# Patient Record
Sex: Male | Born: 1935 | Race: White | Hispanic: No | Marital: Married
Health system: Southern US, Community
[De-identification: ages and names within clinical notes are randomized; demographics above are authoritative.]

---

## 2012-03-03 ENCOUNTER — Emergency Department: Payer: Self-pay

## 2012-03-03 LAB — COMPREHENSIVE METABOLIC PANEL
Albumin: 3.4 g/dL (ref 3.4–5.0)
Alkaline Phosphatase: 89 U/L (ref 50–136)
BUN: 16 mg/dL (ref 7–18)
Bilirubin,Total: 0.8 mg/dL (ref 0.2–1.0)
Chloride: 105 mmol/L (ref 98–107)
Co2: 27 mmol/L (ref 21–32)
Creatinine: 0.75 mg/dL (ref 0.60–1.30)
EGFR (African American): >60
EGFR (Non-African Amer.): >60
Glucose: 145 mg/dL — ABNORMAL HIGH (ref 65–99)
Osmolality: 283 (ref 275–301)
Potassium: 4 mmol/L (ref 3.5–5.1)
SGOT(AST): 28 U/L (ref 15–37)
SGPT (ALT): 20 U/L (ref 12–78)
Total Protein: 7 g/dL (ref 6.4–8.2)

## 2012-03-03 LAB — URINALYSIS, COMPLETE
Blood: NEGATIVE
Ketone: NEGATIVE
Nitrite: NEGATIVE
Ph: 7 (ref 4.5–8.0)
Protein: NEGATIVE
Specific Gravity: 1.012 (ref 1.003–1.030)
WBC UR: <1 /HPF (ref 0–5)

## 2012-03-03 LAB — CBC
HGB: 14.8 g/dL (ref 13.0–18.0)
MCHC: 33.4 g/dL (ref 32.0–36.0)
RDW: 12.9 % (ref 11.5–14.5)

## 2012-04-25 ENCOUNTER — Ambulatory Visit: Payer: Self-pay | Admitting: Internal Medicine

## 2012-04-29 ENCOUNTER — Inpatient Hospital Stay: Payer: Self-pay | Admitting: Internal Medicine

## 2012-04-29 LAB — CBC
HCT: 42.5 % (ref 40.0–52.0)
MCHC: 33.1 g/dL (ref 32.0–36.0)
MCV: 99 fL (ref 80–100)
Platelet: 234 10*3/uL (ref 150–440)
RBC: 4.28 10*6/uL — ABNORMAL LOW (ref 4.40–5.90)
RDW: 14.5 % (ref 11.5–14.5)
WBC: 15.3 10*3/uL — ABNORMAL HIGH (ref 3.8–10.6)

## 2012-04-29 LAB — COMPREHENSIVE METABOLIC PANEL
Anion Gap: 4 — ABNORMAL LOW (ref 7–16)
BUN: 17 mg/dL (ref 7–18)
Bilirubin,Total: 0.8 mg/dL (ref 0.2–1.0)
Chloride: 100 mmol/L (ref 98–107)
Creatinine: 0.69 mg/dL (ref 0.60–1.30)
EGFR (African American): >60
Osmolality: 272 (ref 275–301)
Potassium: 4.6 mmol/L (ref 3.5–5.1)
SGOT(AST): 42 U/L — ABNORMAL HIGH (ref 15–37)
Sodium: 134 mmol/L — ABNORMAL LOW (ref 136–145)
Total Protein: 6 g/dL — ABNORMAL LOW (ref 6.4–8.2)

## 2012-04-29 LAB — PROTIME-INR: INR: 1

## 2012-04-29 LAB — URINALYSIS, COMPLETE
Leukocyte Esterase: NEGATIVE
Nitrite: NEGATIVE
Protein: NEGATIVE
RBC,UR: <1 /HPF (ref 0–5)
WBC UR: 1 /HPF (ref 0–5)

## 2012-04-29 LAB — TROPONIN I: Troponin-I: 0.08 ng/mL — ABNORMAL HIGH

## 2012-05-26 ENCOUNTER — Ambulatory Visit: Payer: Self-pay | Admitting: Internal Medicine

## 2012-07-24 DEATH — deceased

## 2013-09-12 IMAGING — CT CT HEAD WITHOUT CONTRAST
2 series · 15 of 30 positions shown, 19 images · non-contrast
Comparison: none

REASON FOR EXAM: change inms
COMMENTS:

[Series 2: soft tissue · axial · 0.43mm/px · z∈[-128,-3]mm · 13 of 31 slices shown, 17 images]
[im 3/31  brain]
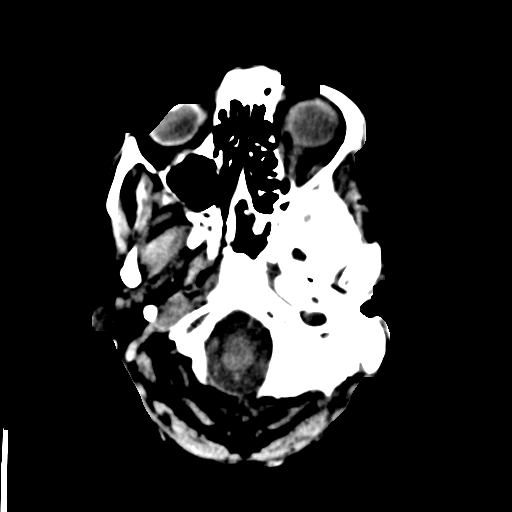
[im 3/31  bone]
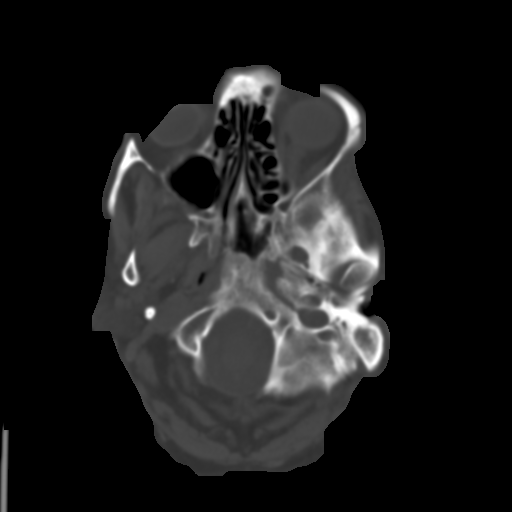
[im 5/31  brain]
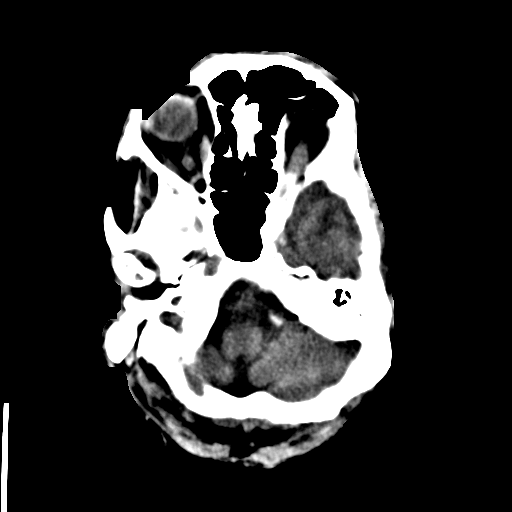
[im 7/31  brain]
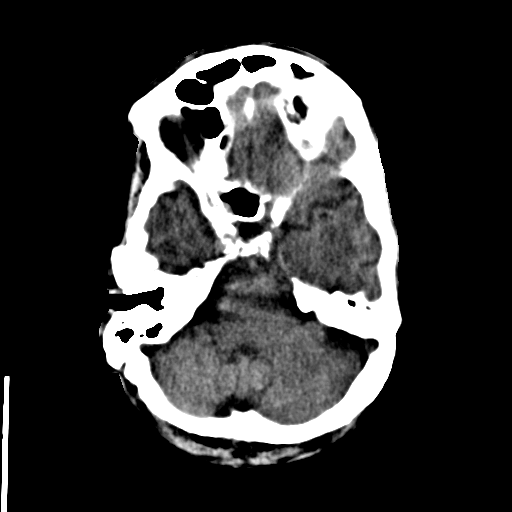
[im 9/31  brain]
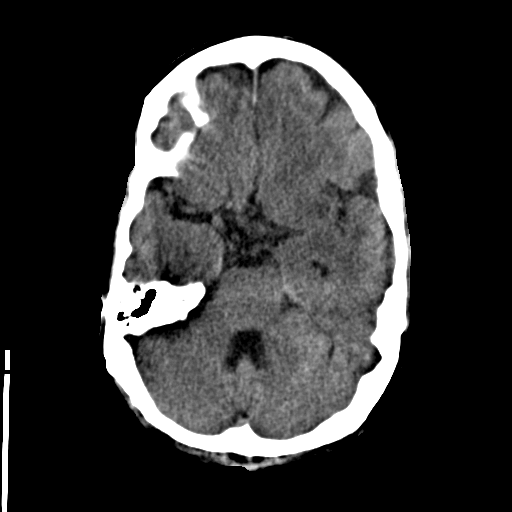
[im 11/31  brain]
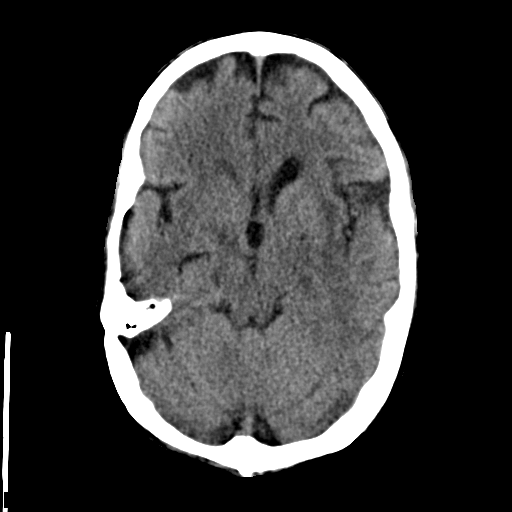
[im 11/31  bone]
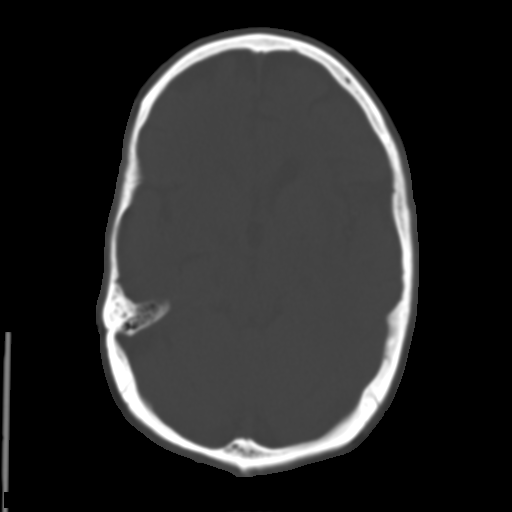
[im 13/31  brain]
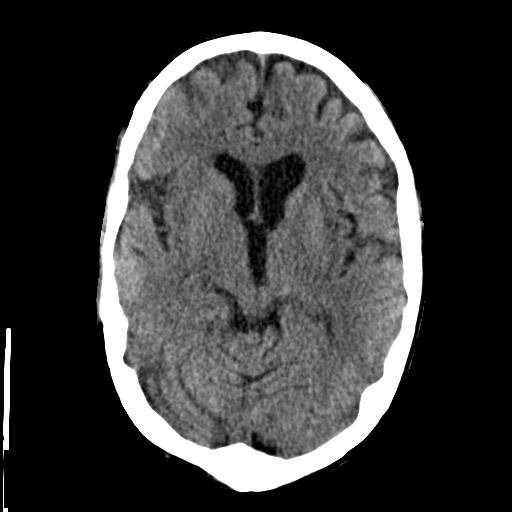
[im 16/31  brain]
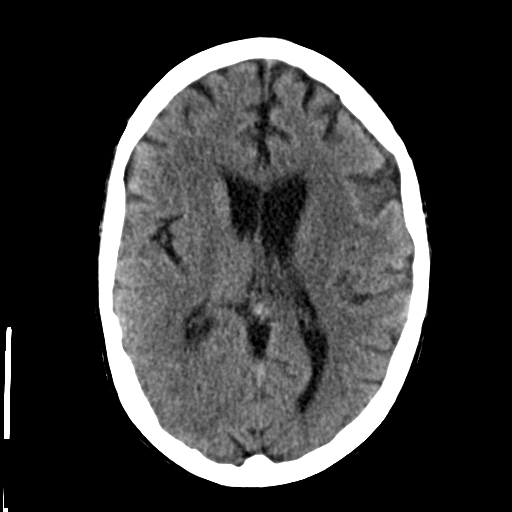
[im 18/31  brain]
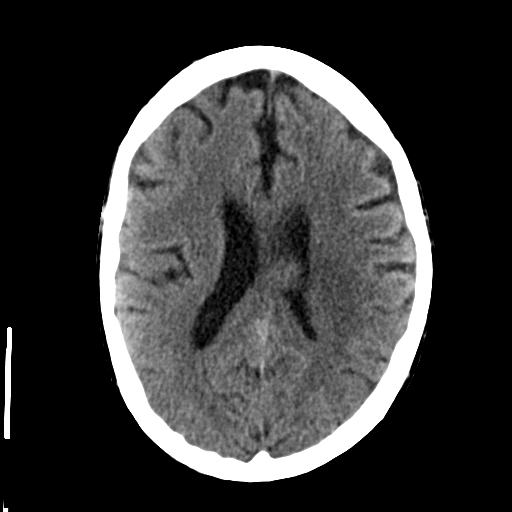
[im 20/31  brain]
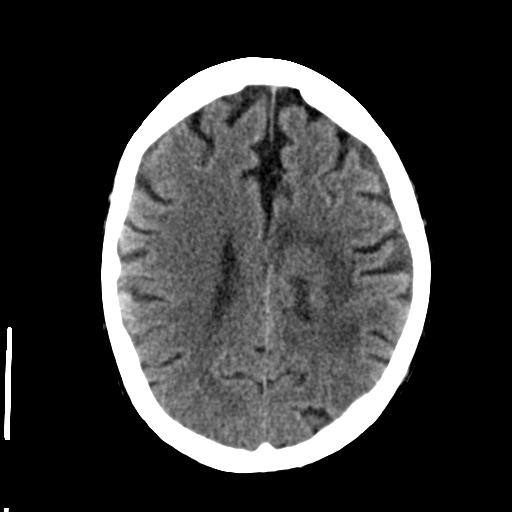
[im 20/31  bone]
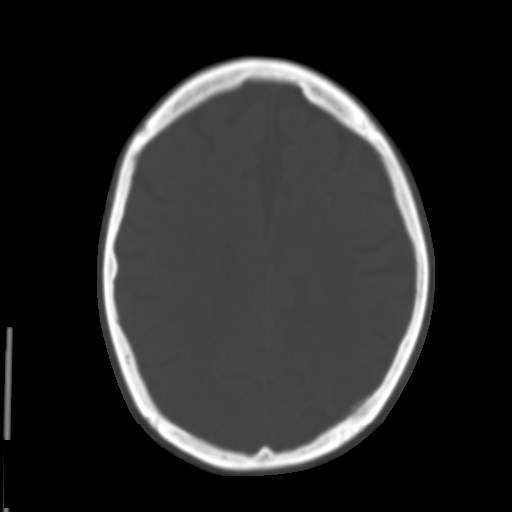
[im 22/31  brain]
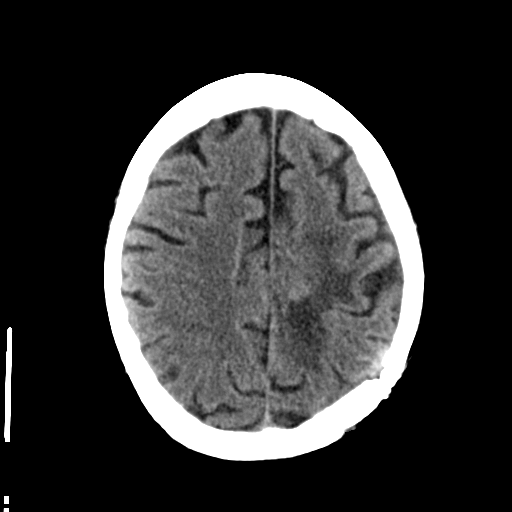
[im 24/31  brain]
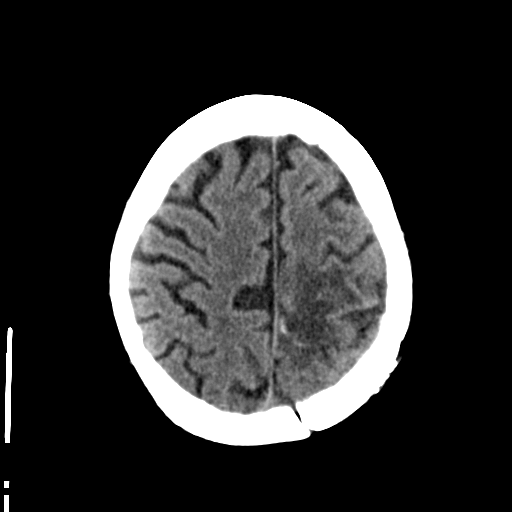
[im 26/31  brain]
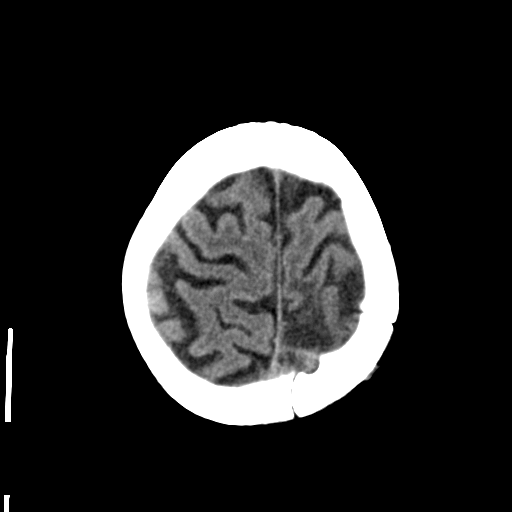
[im 28/31  brain]
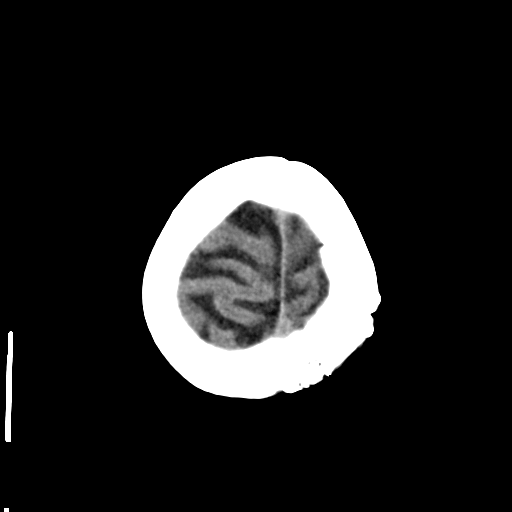
[im 28/31  bone]
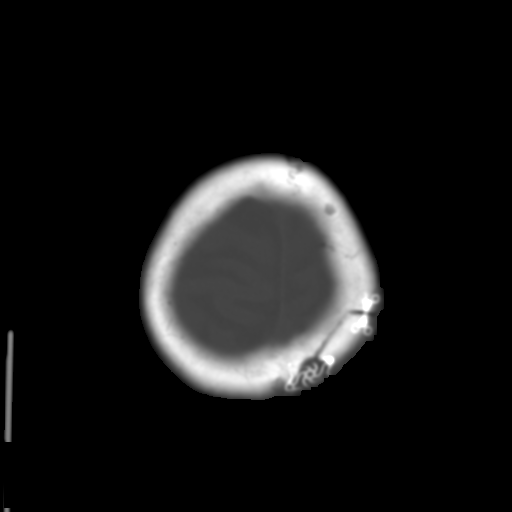

[Series 3: bone · axial · 0.43mm/px · z∈[-128,-108]mm · 2 of 33 slices shown]
[im 3/33  bone]
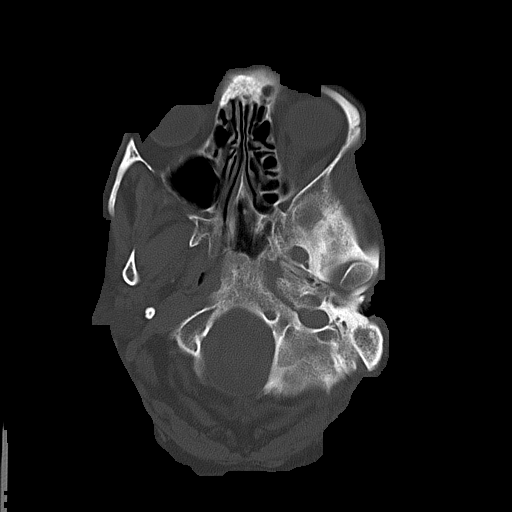
[im 7/33  bone]
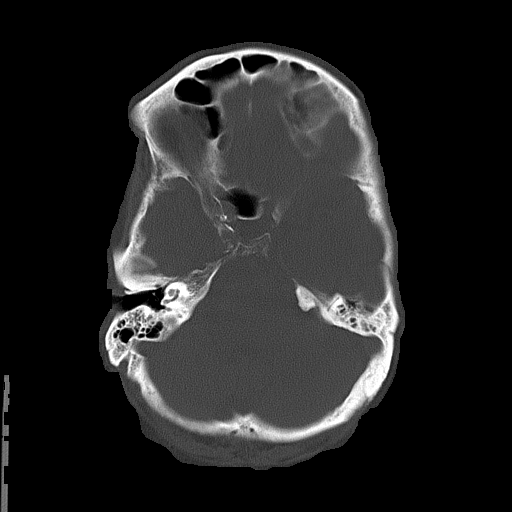

[15 of 30 positions shown; findings below may reference images not displayed]

PROCEDURE:     CT  - CT HEAD WITHOUT CONTRAST  - April 29, 2012  [DATE]

RESULT:     Axial noncontrast CT scanning was performed through the brain
with reconstructions at 5 mm intervals and slice thicknesses. Comparison is
made to a study March 03, 2012.

There is mild to moderate diffuse cerebral and cerebellar atrophy. There is
an area of encephalomalacia in the high posterior frontal an anterior
parietal lobe on the left. The patient has undergone interval craniectomy
and surgery upon the parietal lobe since the previous study. Minimal linear
in density adjacent to the area of edema on images 23-26.  This could
reflect a tiny amount of blood however. It was not on the preoperative
study. It does not appear to be resulting in any mass effect. The degree of
edema is less conspicuous than on the previous study. There is no shift of
the midline. There is very mild ex vacuo dilation of the left lateral
ventricle. There is no evidence of an acute intracranial hemorrhage
elsewhere. There are no findings to suggest an acute ischemic event.

At bone window settings there is soft tissue density material in the left
maxillary sinus which is not new. There is no evidence of an acute skull
fracture. The patient has undergone interval craniectomy craniectomy in the
posterior parietal bone on the left.
IMPRESSION: 1. Since the previous study the patient has undergone per craniectomy and
presumed biopsy of a suspected mass in the high parietal lobe exhibiting
surrounding edema. The amount of edema has decreased. There is linear
increased density on images 23 - 26 which could reflect a tiny amount of
hemorrhage, but this is not resulting in any mass effect upon the
surrounding brain tissue. This can be reassessed with a followup CT scan in
24 to 48 hours
2. There is mild acute diffuse cerebral atrophy.
3. There is no evidence of an evolving ischemic event.

[REDACTED]

## 2014-09-12 NOTE — Discharge Summary (Signed)
PATIENT NAME:  Andrew Choi, Andrew Choi MR#:  [id number] DATE OF BIRTH:  03-Nov-1925  DATE OF ADMISSION:  04/29/2012 DATE OF DISCHARGE:  04/30/2012  ADMISSION DIAGNOSIS: Cerebral edema with possible hemorrhage.   DISCHARGE DIAGNOSES: 1. Cerebral edema with history of glioblastoma multiforme with possible brain hemorrhage.  2. Encephalopathy secondary to seizure.  3. Deep venous thrombosis.  4. Diabetes. 5. Leukocytosis.   CONSULTANTS: Hospice and Palliative Care.   RADIOLOGIC STUDIES: CT of the head without contrast showed the amount of brain edema has decreased. There is linear increased density which could reflect a tiny amount of hemorrhage, but this is not resulting in any mass affect upon the surrounding brain tissue.   HOSPITAL COURSE:  This is an 81 year old male with a history of GBM who presented with lethargy and altered mental status. He was found to have an abnormal CT scan. For further details, please refer to the history and physical.  1. GBM with possible brain hemorrhage with some cerebral edema. The patient's overall prognosis is very poor. We had palliative care and hospice see him. The patient is going to go to the hospice home facility. He is on empiric Decadron for edema.  2. Encephalopathy and seizure. Empiric Cerebyx, which has not been discontinued.  3. History of deep vein thrombosis. Unable to treat. Hold off on filter placement.  4. Diabetes.  5. Leukocytosis and fever, possibly related to seizure versus pneumonia versus atelectasis. No further work-up as the patient is now going to the hospice home.  6. Elevated troponin. Again likely no work-up at this time.   DISCHARGE MEDICATIONS:  1. Roxanol 20 mg/mL, 0.5 to 1.0 mL p.o. sublingual every 1 to 2 hours p.r.n. pain/dyspnea.  2. Ativan 0.5 to 1 mg p.o. sublingual every 2 to 4 hours p.r.n. agitation and anxiety.  3. Ranitidine 150 mg twice a day.  4. ABHR suppository one per rectum every 4 to 6 hours.  5. Decadron 4 mg  p.o. twice a day  DISPOSITION: The patient will be discharged to the hospice home. ____________________________ Nathanel Tallman P. Benjie Karvonen, MD spm:[id number] D: 04/30/2012 11:59:29 ET T: 04/30/2012 12:55:53 ET JOB#: [id number]  cc: Keasha Malkiewicz P. Benjie Karvonen, MD, <Dictator> Donell Beers Sandy Blouch MD ELECTRONICALLY SIGNED 04/30/2012 13:26

## 2014-09-12 NOTE — H&P (Signed)
PATIENT NAME:  Andrew Choi, Andrew Choi MR#:  [id number] DATE OF BIRTH:  01-04-1926  DATE OF ADMISSION:  04/29/2012  PRIMARY CARE PHYSICIAN: Dr. Brynda Greathouse at [facility] in for altered mental status.   HISTORY OF PRESENT ILLNESS: This is an 81 year old man who has been having some shaking lasting a minute or two of the right side starting yesterday and today he has been out of it. He has had periods of confusion and altered mental status today where his shoulders and right arm were shaking also. He had a brain surgery for a glioblastoma multiforme on October 14th and was told that there was nothing that could be done and he was sent over to the rehab but, as per the family, he has been up with physical therapy and walking with PT prior to this. He also has a history of DVT bilateral lower extremities and he is on high dose Lovenox. In the ER, a CT scan of the head was done that showed since the previous study the patient has undergone prior craniotomy and presumed biopsy, suspected mass in the high parietal lobe and it has edema, could reflect a tiny amount of hemorrhage, mild acute diffuse cerebral atrophy. The patient was offered transfer back to [facility]. The family had declined since there is nothing to be done for the brain tumor at this point. Hospitalist services were contacted for further evaluation. Of note, the patient had an elevated white count and a low-grade temperature. The patient is a poor historian and does not answer some yes or no questions and follows some simple commands.   PAST MEDICAL HISTORY:  1. Glioblastoma multiforme with residual right-sided weakness. 2. Diabetes. 3. DVT, on high dose Lovenox.  4. Hypertension.  5. Hyperlipidemia.   PAST SURGICAL HISTORY:  1. Tumor on the brain. 2. Cancer of the nose, status post skin flap.   ALLERGIES: No known drug allergies.    MEDICATIONS:  1. Tylenol p.r.n.  2. Atenolol 50 mg daily.  3. Lovenox 70 mg  subcutaneous injection twice a day.  4. Multivitamin with minerals daily.  5. NovoLog Flex pen sliding scale.  6. Pravastatin 40 mg at bedtime.   SOCIAL HISTORY: Currently at [facility]. No smoking. No alcohol. No drug use. Used to work heavy equipment operation in the past.   FAMILY HISTORY: Father died of a young age, unknown cause. Mother died in her 24's, heart related issues.   REVIEW OF SYSTEMS: Difficult to obtain secondary to altered mental status.   PHYSICAL EXAMINATION:   VITAL SIGNS: Temperature 99.6, pulse 58, respirations 16, blood pressure 110/63, pulse oximetry 95% on room air.   GENERAL: No respiratory distress.   EYES: Conjunctivae and lids normal. Pupils equal, round, reactive to light. Unable to test extraocular muscles, not specifically following most commands.   EARS, NOSE, MOUTH, AND THROAT: Nasal mucosa no erythema. Throat no erythema. No exudate seen. Lips and gums no lesions.   NECK: No JVD. No bruits. No lymphadenopathy. No thyromegaly. No thyroid nodules palpated.   RESPIRATORY: Lungs clear to auscultation. No use of accessory muscles to breathe. No rhonchi, rales, or wheeze heard.   CARDIOVASCULAR: S1, S2 normal. No gallops, rubs, or murmurs heard. Carotid upstroke 2+ bilaterally. No bruits. Dorsalis pedis pulses 2+ bilaterally. 2+ edema bilateral lower extremity.   ABDOMEN: Soft, nontender. No organomegaly/splenomegaly. Normoactive bowel sounds. No masses felt.   LYMPHATIC: No lymph nodes in the neck.   MUSCULOSKELETAL: 2+ edema. No  clubbing. No cyanosis.   SKIN: Chronic lower extremity discoloration. Unable to examine posteriorly on my own.   NEUROLOGIC: The patient tries to follow some simple commands. Some he is able to follow, others not. Left hand strength 5 out of 5; right hand strength 4 out of 5. Unable to lift right arm. Right leg unable to lift off the bed. Left leg unable to lift up off the bed. Able to wiggle toes bilaterally.  Babinski equivocal bilaterally. Reflexes 1+ bilateral lower extremity.   PSYCHIATRIC: The patient is unable to tell me his name. Unclear if he is able recognize family. Unable to test orientation.  LABORATORY AND RADIOLOGICAL DATA: CT scan of the head shows prior craniotomy, mass high parietal lobe with surrounding edema, possible tiny hemorrhage.   Urinalysis negative. Glucose 134, BUN 17, creatinine 0.69, sodium 134, potassium 4.6, chloride 100, CO2 30, calcium 8.3. Liver function tests albumin low at 2.8, AST slightly elevated at 42. White blood cell count 15.3, hemoglobin and hematocrit 14.0 and 42.5, platelet count 234. Troponin borderline at 0.08. INR 1.0.   ASSESSMENT AND PLAN:  1. Possible brain hemorrhage with cerebral edema with history of glioblastoma multiforme. I am unable to give any blood thinner at this time. Unable to treat the DVT. Overall prognosis poor with glioblastoma multiforme and cerebral edema. Will get a Palliative Care consultation. Would probably benefit with Hospice either at the facility or at the Hospice home depending on course.  2. Encephalopathy and seizure. Will give empiric Cerebyx load and 100 mg IV q.8 hours. Check a Dilantin level in the a.m. Empiric Decadron for edema.  3. DVT. Unable to treat with blood thinner at this time for possible hemorrhage. Since they are leaning towards Hospice, I will not put in a filter for the DVT.  4. Diabetes. Will put on sliding scale. Sugars will be high secondary to steroids.  5. With altered mental status will get speech to evaluate his swallowing ability.  6. Palliative Care consultation. The patient is a DO NOT RESUSCITATE. Would benefit from Hospice either at the facility or Hospice home depending on course.  7. Leukocytosis and fever. Urinalysis is negative. Will obtain a chest x-ray. Fever and leukocytosis could also be secondary to seizure also.  8. Elevated troponin. I will not order telemetry. I will not order  further cardiac enzymes. Unable to give anticoagulation or beta-blocker at this time secondary to hemorrhage and relative hypotension.   Overall prognosis is poor. High risk of mortality. The patient is a DO NOT RESUSCITATE. With his underlying diagnosis of glioblastoma multiforme, would benefit from Hospice following either at his facility or at [facility] home.   TIME SPENT ON ADMISSION: 55 minutes.   ____________________________ Tana Conch. Leslye Peer, MD [id number] D: 04/29/2012 18:55:49 ET T: 04/30/2012 05:48:41 ET JOB#: [id number]  cc: Tana Conch. Leslye Peer, MD, <Dictator> Mikeal Hawthorne. Brynda Greathouse, MD Marisue Brooklyn MD ELECTRONICALLY SIGNED 04/30/2012 16:24
